# Patient Record
Sex: Male | Born: 1970 | Race: Black or African American | Hispanic: No | Marital: Married | Smoking: Former smoker
Health system: Southern US, Community
[De-identification: ages and names within clinical notes are randomized; demographics above are authoritative.]

---

## 1998-06-23 ENCOUNTER — Emergency Department (HOSPITAL_COMMUNITY): Admission: EM | Admit: 1998-06-23 | Discharge: 1998-06-23 | Payer: Self-pay | Admitting: Internal Medicine

## 1998-11-24 ENCOUNTER — Emergency Department (HOSPITAL_COMMUNITY): Admission: EM | Admit: 1998-11-24 | Discharge: 1998-11-24 | Payer: Self-pay | Admitting: Emergency Medicine

## 2000-09-21 ENCOUNTER — Emergency Department (HOSPITAL_COMMUNITY): Admission: EM | Admit: 2000-09-21 | Discharge: 2000-09-21 | Payer: Self-pay | Admitting: Emergency Medicine

## 2002-07-15 ENCOUNTER — Emergency Department (HOSPITAL_COMMUNITY): Admission: EM | Admit: 2002-07-15 | Discharge: 2002-07-15 | Payer: Self-pay

## 2003-10-25 ENCOUNTER — Emergency Department (HOSPITAL_COMMUNITY): Admission: EM | Admit: 2003-10-25 | Discharge: 2003-10-25 | Payer: Self-pay | Admitting: Emergency Medicine

## 2004-07-27 ENCOUNTER — Emergency Department (HOSPITAL_COMMUNITY): Admission: EM | Admit: 2004-07-27 | Discharge: 2004-07-27 | Payer: Self-pay | Admitting: Emergency Medicine

## 2004-09-21 ENCOUNTER — Emergency Department (HOSPITAL_COMMUNITY): Admission: EM | Admit: 2004-09-21 | Discharge: 2004-09-22 | Payer: Self-pay | Admitting: Internal Medicine

## 2005-07-21 ENCOUNTER — Emergency Department (HOSPITAL_COMMUNITY): Admission: EM | Admit: 2005-07-21 | Discharge: 2005-07-21 | Payer: Self-pay | Admitting: Emergency Medicine

## 2008-12-15 ENCOUNTER — Emergency Department (HOSPITAL_COMMUNITY): Admission: EM | Admit: 2008-12-15 | Discharge: 2008-12-15 | Payer: Self-pay | Admitting: Emergency Medicine

## 2012-06-03 ENCOUNTER — Emergency Department (HOSPITAL_COMMUNITY)
Admission: EM | Admit: 2012-06-03 | Discharge: 2012-06-03 | Disposition: A | Discharge status: Home / Self Care | Payer: Self-pay | Attending: Emergency Medicine | Admitting: Emergency Medicine

## 2012-06-03 ENCOUNTER — Encounter (HOSPITAL_COMMUNITY): Payer: Self-pay | Admitting: Emergency Medicine

## 2012-06-03 DIAGNOSIS — R209 Unspecified disturbances of skin sensation: Secondary | ICD-10-CM | POA: Insufficient documentation

## 2012-06-03 DIAGNOSIS — F17200 Nicotine dependence, unspecified, uncomplicated: Secondary | ICD-10-CM | POA: Insufficient documentation

## 2012-06-03 DIAGNOSIS — M25519 Pain in unspecified shoulder: Secondary | ICD-10-CM | POA: Insufficient documentation

## 2012-06-03 DIAGNOSIS — M549 Dorsalgia, unspecified: Secondary | ICD-10-CM | POA: Insufficient documentation

## 2012-06-03 MED ORDER — NAPROXEN 375 MG PO TABS: 375.0000 mg | ORAL_TABLET | Freq: Two times a day (BID) | ORAL | Status: DC

## 2012-06-03 NOTE — ED Notes (Signed)
Patient reports that it is painful to sit up straight since he was a child. The patient reports that he has continue back pain and saw a dr. Last year and was told that he may have polio. The patient reports that over the last  4-5 days that pain has worsened and he has some numbness and tingling to his right arm.

## 2012-06-03 NOTE — ED Provider Notes (Signed)
History     CSN: [id number]  Arrival date & time 06/03/12  1315   First MD Initiated Contact with Patient 06/03/12 1559      Chief Complaint  Patient presents with  . Back Pain    (Consider location/radiation/quality/duration/timing/severity/associated sxs/prior treatment) HPI Alex Estrada is a 41 y.o. male who presents with complaint of feeling pain in upper back and right shoulder. States he believes that his entire right side, leg, arm, and possibly organs are smaller on right side. States since childhood, he has had problems with pain to right side due to this. States now he is wearing one orthotics in right shoe. States when sitting down, he has to put a book under right side of the buttock, and when sleeps, usually has an extra mattress pad under right side. Pt states at times gets numbness and tingling in hands. States no pain in the neck or lower back. No problems walking. No chest or abdominal pain. No there complaints. Has not been evaluated for this problem before.      History reviewed. No pertinent past medical history.  History reviewed. No pertinent past surgical history.  No family history on file.  History  Substance Use Topics  . Smoking status: Current Every Day Smoker  . Smokeless tobacco: Not on file  . Alcohol Use: Yes     Comment: occ      Review of Systems  Constitutional: Negative for fever and chills.  HENT: Negative for neck pain and neck stiffness.   Respiratory: Negative.   Cardiovascular: Negative.   Musculoskeletal: Positive for arthralgias. Negative for myalgias.  Skin: Negative.   Neurological: Positive for numbness. Negative for weakness.    Allergies  Review of patient's allergies indicates no known allergies.  Home Medications  No current outpatient prescriptions on file.  BP 117/65  Pulse 73  Temp 98.1 F (36.7 C) (Oral)  Resp 16  SpO2 100%  Physical Exam  Nursing note and vitals reviewed. Constitutional: He is  oriented to person, place, and time. He appears well-developed and well-nourished. No distress.  HENT:  Head: Normocephalic.  Eyes: Conjunctivae normal are normal.  Neck: Normal range of motion. Neck supple.  Cardiovascular: Normal rate, regular rhythm and normal heart sounds.   Pulmonary/Chest: Effort normal and breath sounds normal. No respiratory distress. He has no wheezes. He has no rales.  Musculoskeletal:       Full rom of bilateral shoulders, elbows, hips, knees. Good strength in all extremities. Entire spine non tender. i do not see any evidence of size difference or atrophy between right and left sides/extremities. No signs of skoliosis with pt bending forward. Gait normal. He is tender over the left upper back muscles just medial to the scapula.   Neurological: He is alert and oriented to person, place, and time.  Skin: Skin is warm and dry.    ED Course  Procedures (including critical care time)  Pt here with some pain to the right upper back and right shoulder and apparently believing his right side is smaller then left. Although this may be true, i do not see obvious difference on my exam, and believe pt will need further evaluation by a specialist, such an orthopedist. He may also benefit from specialised orthotics and physical therapy if that is the case. Will refer to ortho. He is otherwise non toxic, no distress. VS normal. Stable for d/c home in naprosyn for pain.   1. Upper back pain on right side  MDM         Lottie Mussel, PA 06/03/12 2330

## 2012-06-03 NOTE — ED Provider Notes (Signed)
Medical screening examination/treatment/procedure(s) were performed by non-physician practitioner and as supervising physician I was immediately available for consultation/collaboration.   Ruby Dilone H Rollen Selders, MD 06/03/12 2347 

## 2013-11-13 ENCOUNTER — Emergency Department (HOSPITAL_COMMUNITY)
Admission: EM | Admit: 2013-11-13 | Discharge: 2013-11-13 | Disposition: A | Discharge status: Home / Self Care | Payer: PRIVATE HEALTH INSURANCE | Attending: Emergency Medicine | Admitting: Emergency Medicine

## 2013-11-13 ENCOUNTER — Emergency Department (HOSPITAL_COMMUNITY): Payer: PRIVATE HEALTH INSURANCE

## 2013-11-13 ENCOUNTER — Encounter (HOSPITAL_COMMUNITY): Payer: Self-pay | Admitting: Emergency Medicine

## 2013-11-13 DIAGNOSIS — M79672 Pain in left foot: Secondary | ICD-10-CM

## 2013-11-13 DIAGNOSIS — M25579 Pain in unspecified ankle and joints of unspecified foot: Secondary | ICD-10-CM | POA: Insufficient documentation

## 2013-11-13 DIAGNOSIS — Z791 Long term (current) use of non-steroidal anti-inflammatories (NSAID): Secondary | ICD-10-CM | POA: Insufficient documentation

## 2013-11-13 DIAGNOSIS — F17200 Nicotine dependence, unspecified, uncomplicated: Secondary | ICD-10-CM | POA: Insufficient documentation

## 2013-11-13 MED ORDER — NAPROXEN 500 MG PO TABS: 500.0000 mg | ORAL_TABLET | Freq: Two times a day (BID) | ORAL | Status: DC

## 2013-11-13 NOTE — Progress Notes (Signed)
Orthopedic Tech Progress Note Patient Details:  Alex Estrada 1970/07/01 [id number] Patient refused cam walker boot Dr. Truitt MerleNotified.  Patient ID: Alex Estrada, male   DOB: 1970/07/01, 43 y.o.   MRN: [id number]   Jennye Moccasinnthony Craig Math Brazie 11/13/2013, 5:50 PM

## 2013-11-13 NOTE — ED Notes (Signed)
Pt presents with Left ankle pain, states he was walking at work when he noticed the pain, pt denies any injury, but reports feeling "something protruding out."

## 2013-11-13 NOTE — ED Notes (Signed)
Pt. Refused the Cam Walker

## 2013-11-13 NOTE — Discharge Instructions (Signed)
Cryotherapy °Cryotherapy means treatment with cold. Ice or gel packs can be used to reduce both pain and swelling. Ice is the most helpful within the first 24 to 48 hours after an injury or flareup from overusing a muscle or joint. Sprains, strains, spasms, burning pain, shooting pain, and aches can all be eased with ice. Ice can also be used when recovering from surgery. Ice is effective, has very few side effects, and is safe for most people to use. °PRECAUTIONS  °Ice is not a safe treatment option for people with: °· Raynaud's phenomenon. This is a condition affecting small blood vessels in the extremities. Exposure to cold may cause your problems to return. °· Cold hypersensitivity. There are many forms of cold hypersensitivity, including: °· Cold urticaria. Red, itchy hives appear on the skin when the tissues begin to warm after being iced. °· Cold erythema. This is a red, itchy rash caused by exposure to cold. °· Cold hemoglobinuria. Red blood cells break down when the tissues begin to warm after being iced. The hemoglobin that carry oxygen are passed into the urine because they cannot combine with blood proteins fast enough. °· Numbness or altered sensitivity in the area being iced. °If you have any of the following conditions, do not use ice until you have discussed cryotherapy with your caregiver: °· Heart conditions, such as arrhythmia, angina, or chronic heart disease. °· High blood pressure. °· Healing wounds or open skin in the area being iced. °· Current infections. °· Rheumatoid arthritis. °· Poor circulation. °· Diabetes. °Ice slows the blood flow in the region it is applied. This is beneficial when trying to stop inflamed tissues from spreading irritating chemicals to surrounding tissues. However, if you expose your skin to cold temperatures for too long or without the proper protection, you can damage your skin or nerves. Watch for signs of skin damage due to cold. °HOME CARE INSTRUCTIONS °Follow  these tips to use ice and cold packs safely. °· Place a dry or damp towel between the ice and skin. A damp towel will cool the skin more quickly, so you may need to shorten the time that the ice is used. °· For a more rapid response, add gentle compression to the ice. °· Ice for no more than 10 to 20 minutes at a time. The bonier the area you are icing, the less time it will take to get the benefits of ice. °· Check your skin after 5 minutes to make sure there are no signs of a poor response to cold or skin damage. °· Rest 20 minutes or more in between uses. °· Once your skin is numb, you can end your treatment. You can test numbness by very lightly touching your skin. The touch should be so light that you do not see the skin dimple from the pressure of your fingertip. When using ice, most people will feel these normal sensations in this order: cold, burning, aching, and numbness. °· Do not use ice on someone who cannot communicate their responses to pain, such as small children or people with dementia. °HOW TO MAKE AN ICE PACK °Ice packs are the most common way to use ice therapy. Other methods include ice massage, ice baths, and cryo-sprays. Muscle creams that cause a cold, tingly feeling do not offer the same benefits that ice offers and should not be used as a substitute unless recommended by your caregiver. °To make an ice pack, do one of the following: °· Place crushed ice or   a bag of frozen vegetables in a sealable plastic bag. Squeeze out the excess air. Place this bag inside another plastic bag. Slide the bag into a pillowcase or place a damp towel between your skin and the bag.  Mix 3 parts water with 1 part rubbing alcohol. Freeze the mixture in a sealable plastic bag. When you remove the mixture from the freezer, it will be slushy. Squeeze out the excess air. Place this bag inside another plastic bag. Slide the bag into a pillowcase or place a damp towel between your skin and the bag. SEEK MEDICAL  CARE IF:  You develop white spots on your skin. This may give the skin a blotchy (mottled) appearance.  Your skin turns blue or pale.  Your skin becomes waxy or hard.  Your swelling gets worse. MAKE SURE YOU:   Understand these instructions.  Will watch your condition.  Will get help right away if you are not doing well or get worse. Document Released: 02/07/2011 Document Revised: 09/05/2011 Document Reviewed: 02/07/2011 ExitCare P Pain of Unknown Etiology (Pain Without a Known Cause) You have come to your caregiver because of pain. Pain can occur in any part of the body. Often there is not a definite cause. If your laboratory (blood or urine) work was normal and X-rays or other studies were normal, your caregiver may treat you without knowing the cause of the pain. An example of this is the headache. Most headaches are diagnosed by taking a history. This means your caregiver asks you questions about your headaches. Your caregiver determines a treatment based on your answers. Usually testing done for headaches is normal. Often testing is not done unless there is no response to medications. Regardless of where your pain is located today, you can be given medications to make you comfortable. If no physical cause of pain can be found, most cases of pain will gradually leave as suddenly as they came.  If you have a painful condition and no reason can be found for the pain, it is important that you follow up with your caregiver. If the pain becomes worse or does not go away, it may be necessary to repeat tests and look further for a possible cause.  Only take over-the-counter or prescription medicines for pain, discomfort, or fever as directed by your caregiver.  For the protection of your privacy, test results cannot be given over the phone. Make sure you receive the results of your test. Ask how these results are to be obtained if you have not been informed. It is your responsibility to obtain  your test results.  You may continue all activities unless the activities cause more pain. When the pain lessens, it is important to gradually resume normal activities. Resume activities by beginning slowly and gradually increasing the intensity and duration of the activities or exercise. During periods of severe pain, bed rest may be helpful. Lie or sit in any position that is comfortable.  Ice used for acute (sudden) conditions may be effective. Use a large plastic bag filled with ice and wrapped in a towel. This may provide pain relief.  See your caregiver for continued problems. Your caregiver can help or refer you for exercises or physical therapy if necessary. If you were given medications for your condition, do not drive, operate machinery or power tools, or sign legal documents for 24 hours. Do not drink alcohol, take sleeping pills, or take other medications that may interfere with treatment. See your caregiver immediately if you  have pain that is becoming worse and not relieved by medications. Document Released: 03/08/2001 Document Revised: 04/03/2013 Document Reviewed: 06/13/2005 [facility] Patient Information 2014 [employer], [place]. atient Information [address]2014 ExitCare, [place].

## 2013-11-13 NOTE — ED Provider Notes (Signed)
CSN: [id number]     Arrival date & time 11/13/13  1524 History  This chart was scribed for Arthor CaptainAbigail Maybelline Kolarik, working with Rolland PorterMark James, MD, by Beverly MilchJ Harrison Collins ED Scribe. This patient was seen in room TR05C/TR05C and the patient's care was started at 5:17 PM.    Chief Complaint  Patient presents with  . Foot Pain     Patient is a 43 y.o. male presenting with ankle pain. The history is provided by the patient. No language interpreter was used.  Ankle Pain Location:  Ankle Time since incident:  12 hours Injury: no   Ankle location:  L ankle Pain details:    Quality:  Shooting and aching   Radiates to:  L leg   Severity:  Moderate   Onset quality:  Gradual   Duration:  12 hours   Timing:  Constant   Progression:  Worsening Chronicity:  New Dislocation: no   Foreign body present:  No foreign bodies Tetanus status:  Unknown Prior injury to area:  No Relieved by:  Nothing Worsened by:  Exercise and activity Ineffective treatments:  None tried Associated symptoms: no back pain, no fever and no neck pain     HPI Comments: Haynes Dageaul Swiatek is a 43 y.o. male who presents to the Emergency Department complaining of gradual onset left ankle pain that began this afternoon while he was walking at work at 5 AM. Pt states that as he continued to walk on the left ankle the pain worsened and it went numb with pain shooting up his calf. He states he also feels something "protruding." Pt denies injury. Pt states he works in a [employer], [employer] with concrete floor. He states he has taken nothing to ease his pain. Pt states he ran track and played basketball when he was younger. He denies any h/o similar pain.   History reviewed. No pertinent past medical history. History reviewed. No pertinent past surgical history. History reviewed. No pertinent family history.   History  Substance Use Topics  . Smoking status: Current Every Day Smoker  . Smokeless tobacco: Not on file  . Alcohol Use: Yes     Comment:  occ    Review of Systems  Constitutional: Negative for fever and chills.  HENT: Negative for congestion, rhinorrhea, sore throat and trouble swallowing.   Eyes: Negative for discharge.  Respiratory: Negative for cough.   Musculoskeletal: Positive for arthralgias. Negative for back pain, joint swelling, myalgias, neck pain and neck stiffness.  Neurological: Negative for headaches.     Allergies  Review of patient's allergies indicates no known allergies.   Home Medications   Prior to Admission medications   Medication Sig Start Date End Date Taking? Authorizing Provider  naproxen (NAPROSYN) 375 MG tablet Take 1 tablet (375 mg total) by mouth 2 (two) times daily. 06/03/12   Tatyana A Kirichenko, PA-C    Triage Vitals: BP 114/83  Pulse 82  Temp(Src) 98.8 F (37.1 C) (Oral)  Resp 18  Ht 6\' 1"  (1.854 m)  Wt 223 lb (101.152 kg)  BMI 29.43 kg/m2  SpO2 97%   Physical Exam  Nursing note and vitals reviewed. Constitutional: He is oriented to person, place, and time. He appears well-developed and well-nourished. No distress.  Awake, alert, nontoxic appearance.  HENT:  Head: Normocephalic and atraumatic.  Eyes: EOM are normal. Right eye exhibits no discharge. Left eye exhibits no discharge.  Neck: Neck supple.  Pulmonary/Chest: Effort normal. He exhibits no tenderness.  Abdominal: Soft. There is no tenderness.  There is no rebound.  Musculoskeletal:       Left ankle: Tenderness. Lateral malleolus tenderness found.  Tenderness to the left ankle inferior to the lateral malleolus, there is a callus on the skin and about 1 cm below malleolus a firm nodule that is depressible with firm palpation. It is also tender, no heat, redness, or swelling. DP intact.  Neurological: He is alert and oriented to person, place, and time.  Mental status and motor strength appears baseline for patient and situation.  Skin: Skin is warm and dry. No rash noted. He is not diaphoretic.  Psychiatric: He  has a normal mood and affect. His behavior is normal.    ED Course  Procedures (including critical care time)   DIAGNOSTIC STUDIES: Oxygen Saturation is 97% on RA, normal by my interpretation.     COORDINATION OF CARE: 5:28 PM- Pt advised of plan for treatment and pt agrees.   Imaging Review Dg Ankle Complete Left  11/13/2013   CLINICAL DATA:  Pain over the lateral malleolus, no trauma  EXAM: LEFT ANKLE COMPLETE - 3+ VIEW  COMPARISON:  None.  FINDINGS: There is no evidence of fracture, dislocation, or joint effusion. There is no evidence of arthropathy or other focal bone abnormality. Soft tissues are unremarkable.  IMPRESSION: Negative.   Electronically Signed   By: Christiana PellantGretchen  Green M.D.   On: 11/13/2013 16:53    MDM   Final diagnoses:  Left foot pain    Patient with nodule inferior to the left lat maleolus. ? Ganglion cyst.   Patient X-Ray negative for obvious fracture or dislocation.I personally reviewed the images using our PACS system. Pain managed in ED. Pt advised to follow up with orthopedics if symptoms persist for possibility of missed fracture diagnosis. Patient given brace while in ED, however he refused as he needs to wear steel toed boots at work. Conservative therapy recommended and discussed. Patient will be dc home & is agreeable with above plan.   I personally performed the services described in this documentation, which was scribed in my presence. The recorded information has been reviewed and is accurate.    Arthor CaptainAbigail Jaimon Bugaj, PA-C 11/15/13 1006

## 2013-11-17 NOTE — ED Provider Notes (Signed)
Medical screening examination/treatment/procedure(s) were performed by non-physician practitioner and as supervising physician I was immediately available for consultation/collaboration.   EKG Interpretation None        Kaydra Borgen, MD 11/17/13 0614 

## 2015-06-29 ENCOUNTER — Emergency Department (HOSPITAL_COMMUNITY)
Admission: EM | Admit: 2015-06-29 | Discharge: 2015-06-29 | Disposition: A | Discharge status: Home / Self Care | Payer: PRIVATE HEALTH INSURANCE | Attending: Emergency Medicine | Admitting: Emergency Medicine

## 2015-06-29 ENCOUNTER — Encounter (HOSPITAL_COMMUNITY): Payer: Self-pay

## 2015-06-29 DIAGNOSIS — F17200 Nicotine dependence, unspecified, uncomplicated: Secondary | ICD-10-CM | POA: Insufficient documentation

## 2015-06-29 DIAGNOSIS — Z791 Long term (current) use of non-steroidal anti-inflammatories (NSAID): Secondary | ICD-10-CM | POA: Insufficient documentation

## 2015-06-29 DIAGNOSIS — H6092 Unspecified otitis externa, left ear: Secondary | ICD-10-CM

## 2015-06-29 DIAGNOSIS — J029 Acute pharyngitis, unspecified: Secondary | ICD-10-CM | POA: Insufficient documentation

## 2015-06-29 MED ORDER — CIPROFLOXACIN HCL 500 MG PO TABS: 500.0000 mg | ORAL_TABLET | Freq: Two times a day (BID) | ORAL | Status: AC

## 2015-06-29 MED ORDER — NEOMYCIN-POLYMYXIN-HC 3.5-10000-1 OT SUSP: 4.0000 [drp] | Freq: Three times a day (TID) | OTIC | Status: AC

## 2015-06-29 NOTE — ED Provider Notes (Signed)
CSN: [id number]     Arrival date & time 06/29/15  1717 History   By signing my name below, I, Alex Estrada, attest that this documentation has been prepared under the direction and in the presence of Queens Medical CenterJaime Ward PA-C.  Electronically Signed: Arlan OrganAshley Estrada, ED Scribe. 06/29/2015. 7:01 PM.   Chief Complaint  Patient presents with  . Otalgia   The history is provided by the patient. No language interpreter was used.    HPI Comments: Alex Estrada is a 45 y.o. male without any pertinent past medical history who presents to the Emergency Department complaining of constant, ongoing L otalgia x 3 days. Pain is described as throbbing. Discomfort to ear is exacerbated with palpation. No alleviating factors at this time. Pt admits he swims 3-4 days weekly as he is attempting to lose some weight. He also reports mild white discharge from the L ear. OTC swimmers ear solution attempted at home without any improvement. No recent fever, chills, nausea, vomiting, chest pain, or shortness of breath.  PCP: No primary care provider on file.    No past medical history on file. No past surgical history on file. No family history on file. Social History  Substance Use Topics  . Smoking status: Current Every Day Smoker  . Smokeless tobacco: Not on file  . Alcohol Use: Yes     Comment: occ    Review of Systems  Constitutional: Negative for fever and chills.  HENT: Positive for ear discharge, ear pain and sore throat.   Respiratory: Negative for shortness of breath.   Cardiovascular: Negative for chest pain.  Gastrointestinal: Negative for nausea and vomiting.  Psychiatric/Behavioral: Negative for confusion.      Allergies  Review of patient's allergies indicates no known allergies.  Home Medications   Prior to Admission medications   Medication Sig Start Date End Date Taking? Authorizing Provider  ciprofloxacin (CIPRO) 500 MG tablet Take 1 tablet (500 mg total) by mouth 2 (two) times daily.  06/29/15   Chase PicketJaime Pilcher Ward, PA-C  naproxen (NAPROSYN) 500 MG tablet Take 1 tablet (500 mg total) by mouth 2 (two) times daily with a meal. 11/13/13   Arthor CaptainAbigail Harris, PA-C  neomycin-polymyxin-hydrocortisone (CORTISPORIN) 3.5-10000-1 otic suspension Place 4 drops into the left ear 3 (three) times daily. 06/29/15   Chase PicketJaime Pilcher Ward, PA-C   Triage Vitals: BP 127/81 mmHg  Pulse 84  Temp(Src) 98.3 F (36.8 C) (Oral)  Resp 16  SpO2 98%   Physical Exam  Constitutional: He is oriented to person, place, and time. He appears well-developed and well-nourished.  HENT:  Head: Normocephalic.  Right Ear: Tympanic membrane, external ear and ear canal normal.  Mouth/Throat: Uvula is midline and mucous membranes are normal. No oropharyngeal exudate or posterior oropharyngeal edema.  Left ear with significant canal swelling and white discharge. + tragal tenderness   Eyes: EOM are normal.  Neck: Normal range of motion. Neck supple.  Pulmonary/Chest: Effort normal.  Abdominal: He exhibits no distension.  Musculoskeletal: Normal range of motion.  Neurological: He is alert and oriented to person, place, and time.  Psychiatric: He has a normal mood and affect.  Nursing note and vitals reviewed.   ED Course  Procedures (including critical care time)  DIAGNOSTIC STUDIES: Oxygen Saturation is 98% on RA, Normal by my interpretation.    COORDINATION OF CARE: 6:55 PM-Discussed treatment plan with pt at bedside and pt agreed to plan.     Labs Review Labs Reviewed - No data to display  Imaging  Review No results found. I have personally reviewed and evaluated these images and lab results as part of my medical decision-making.   EKG Interpretation None      MDM   Final diagnoses:  Otitis externa, left   [name] presents with left ear pain and discharge c/w otitis externa  Therapeutics: TM not visible upon initial exam due to discharge, therefore ear was irrigated with saline. After saline  irrigation, TM was visible and intact with no erythema or bulging.   A&P: Otitis externa  - Cortisporin otic, cipro  - Follow up in 3-4 days  - Return precautions given, home care instructions given.   I personally performed the services described in this documentation, which was scribed in my presence. The recorded information has been reviewed and is accurate.  [facility] Ward, PA-C 06/29/15 2008  Nelva Nay, MD 07/04/15 [phone]

## 2015-06-29 NOTE — Discharge Instructions (Signed)
Otitis Externa  Follow up with your primary doctor in 7 days to assure treatment has worked. If there is no improvement in 2-3 days, please follow up sooner. Tylenol and/or motrin as needed for pain  Swimmer's ear (otitis externa) is an infection in the outer ear canal. It can be caused by a germ or a fungus. It may be caused by swimming in dirty water or water that stays in the ear after swimming.   HOME CARE Put drops in the ear canal as told by your doctor.  Only take medicine as told by your doctor.   GET HELP RIGHT AWAY IF:  You have a temperature by mouth above 102 F (38.9 C), not controlled by medicine.  There is ear pain after 3 days.  You develop an allergic reaction, itchy rash, loss of hearing

## 2015-06-29 NOTE — ED Notes (Signed)
PT DISCHARGED. INSTRUCTIONS AND PRESCRIPTIONS GIVEN. AAOX3. PT REFUSED VITAL REASSESSMENT. PT IN NO APPARENT DISTRESS. THE OPPORTUNITY TO ASK QUESTIONS WAS PROVIDED.

## 2015-06-29 NOTE — ED Notes (Signed)
Pt swims regularly, c/o left ear pain. Thick white mucus in ear canal, tympanic membrane intact, pearly gray, pt unable to hold still with otoscope in ear long enough for this RN to further evaluate TM.

## 2015-07-04 ENCOUNTER — Emergency Department (HOSPITAL_COMMUNITY)
Admission: EM | Admit: 2015-07-04 | Discharge: 2015-07-04 | Disposition: A | Discharge status: Home / Self Care | Payer: PRIVATE HEALTH INSURANCE | Attending: Emergency Medicine | Admitting: Emergency Medicine

## 2015-07-04 ENCOUNTER — Encounter (HOSPITAL_COMMUNITY): Payer: Self-pay | Admitting: Nurse Practitioner

## 2015-07-04 DIAGNOSIS — R0981 Nasal congestion: Secondary | ICD-10-CM | POA: Insufficient documentation

## 2015-07-04 DIAGNOSIS — Z792 Long term (current) use of antibiotics: Secondary | ICD-10-CM | POA: Insufficient documentation

## 2015-07-04 DIAGNOSIS — F17200 Nicotine dependence, unspecified, uncomplicated: Secondary | ICD-10-CM | POA: Insufficient documentation

## 2015-07-04 DIAGNOSIS — Z7952 Long term (current) use of systemic steroids: Secondary | ICD-10-CM | POA: Insufficient documentation

## 2015-07-04 DIAGNOSIS — H6092 Unspecified otitis externa, left ear: Secondary | ICD-10-CM | POA: Insufficient documentation

## 2015-07-04 DIAGNOSIS — Z791 Long term (current) use of non-steroidal anti-inflammatories (NSAID): Secondary | ICD-10-CM | POA: Insufficient documentation

## 2015-07-04 MED ORDER — IBUPROFEN 600 MG PO TABS: 600.0000 mg | ORAL_TABLET | Freq: Four times a day (QID) | ORAL | Status: AC | PRN

## 2015-07-04 NOTE — Discharge Instructions (Signed)
Finish your course of Ciprofloxacin; continue using the ear drops given to you as prescribed (4 drops into the left ear 3 (three) times daily for 1 week). Take ibuprofen as needed for pain. You may follow up with an ear, nose, and throat doctor if desired.  Otitis Externa Otitis externa is a bacterial or fungal infection of the outer ear canal. This is the area from the eardrum to the outside of the ear. Otitis externa is sometimes called "swimmer's ear." CAUSES  Possible causes of infection include:  Swimming in dirty water.  Moisture remaining in the ear after swimming or bathing.  Mild injury (trauma) to the ear.  Objects stuck in the ear (foreign body).  Cuts or scrapes (abrasions) on the outside of the ear. SIGNS AND SYMPTOMS  The first symptom of infection is often itching in the ear canal. Later signs and symptoms may include swelling and redness of the ear canal, ear pain, and yellowish-white fluid (pus) coming from the ear. The ear pain may be worse when pulling on the earlobe. DIAGNOSIS  Your health care provider will perform a physical exam. A sample of fluid may be taken from the ear and examined for bacteria or fungi. TREATMENT  Antibiotic ear drops are often given for 10 to 14 days. Treatment may also include pain medicine or corticosteroids to reduce itching and swelling. HOME CARE INSTRUCTIONS   Apply antibiotic ear drops to the ear canal as prescribed by your health care provider.  Take medicines only as directed by your health care provider.  If you have diabetes, follow any additional treatment instructions from your health care provider.  Keep all follow-up visits as directed by your health care provider. PREVENTION   Keep your ear dry. Use the corner of a towel to absorb water out of the ear canal after swimming or bathing.  Avoid scratching or putting objects inside your ear. This can damage the ear canal or remove the protective wax that lines the canal. This  makes it easier for bacteria and fungi to grow.  Avoid swimming in lakes, polluted water, or poorly chlorinated pools.  You may use ear drops made of rubbing alcohol and vinegar after swimming. Combine equal parts of white vinegar and alcohol in a bottle. Put 3 or 4 drops into each ear after swimming. SEEK MEDICAL CARE IF:   You have a fever.  Your ear is still red, swollen, painful, or draining pus after 3 days.  Your redness, swelling, or pain gets worse.  You have a severe headache.  You have redness, swelling, pain, or tenderness in the area behind your ear. MAKE SURE YOU:   Understand these instructions.  Will watch your condition.  Will get help right away if you are not doing well or get worse.   This information is not intended to replace advice given to you by your health care provider. Make sure you discuss any questions you have with your health care provider.   Document Released: 06/13/2005 Document Revised: 07/04/2014 Document Reviewed: 06/30/2011 Elsevier Interactive Patient Education [employer] Inc[employer]

## 2015-07-04 NOTE — ED Notes (Signed)
Pt states he was was seen last week for left ear pain that has not improved. He states he wasn't able to fill the Rx for 2 days.

## 2015-07-04 NOTE — ED Provider Notes (Signed)
CSN: [id number]     Arrival date & time 07/04/15  1954 History   First MD Initiated Contact with Patient 07/04/15 2020     Chief Complaint  Patient presents with  . Otalgia  . Follow-up     (Consider location/radiation/quality/duration/timing/severity/associated sxs/prior Treatment) HPI Comments: 45 year old male presents to the emergency department for evaluation of persistent otalgia. Patient states that he was seen in the emergency department on 06/29/2015. He was diagnosed with otitis externa and prescribed ciprofloxacin as well as Cortisporin Rx. He states that he has been taking ciprofloxacin as prescribed. He has 2 days left on this prescription. He was unable to fill his prescription for Cortisporin eardrops until 2 days ago. He reports having some improvement in symptoms after starting oral abx, but that over the past 48 hours he feels as though his ear has become increasingly clogged. He denies any discharge from his ear or any fever. There is some mild nasal congestion without rhinorrhea he has a sporadic ache in his ear at times for which he has not taken any additional medications  Patient is a 45 y.o. male presenting with ear pain. The history is provided by the patient. No language interpreter was used.  Otalgia Associated symptoms: congestion   Associated symptoms: no ear discharge, no fever and no rhinorrhea     History reviewed. No pertinent past medical history. History reviewed. No pertinent past surgical history. History reviewed. No pertinent family history. Social History  Substance Use Topics  . Smoking status: Current Every Day Smoker  . Smokeless tobacco: None  . Alcohol Use: Yes     Comment: occ    Review of Systems  Constitutional: Negative for fever.  HENT: Positive for congestion and ear pain. Negative for ear discharge and rhinorrhea.   All other systems reviewed and are negative.   Allergies  Review of patient's allergies indicates no known  allergies.  Home Medications   Prior to Admission medications   Medication Sig Start Date End Date Taking? Authorizing Provider  ciprofloxacin (CIPRO) 500 MG tablet Take 1 tablet (500 mg total) by mouth 2 (two) times daily. 06/29/15   Jaime Pilcher Ward, PA-C  ibuprofen (ADVIL,MOTRIN) 600 MG tablet Take 1 tablet (600 mg total) by mouth every 6 (six) hours as needed. 07/04/15   Antony MaduraKelly Danene Montijo, PA-C  naproxen (NAPROSYN) 500 MG tablet Take 1 tablet (500 mg total) by mouth 2 (two) times daily with a meal. 11/13/13   Arthor CaptainAbigail Harris, PA-C  neomycin-polymyxin-hydrocortisone (CORTISPORIN) 3.5-10000-1 otic suspension Place 4 drops into the left ear 3 (three) times daily. 06/29/15   Jaime Pilcher Ward, PA-C   BP 101/76 mmHg  Pulse 88  Temp(Src) 97.8 F (36.6 C) (Oral)  Resp 18  SpO2 96%   Physical Exam  Constitutional: He is oriented to person, place, and time. He appears well-developed and well-nourished. No distress.  Nontoxic/nonseptic appearing. Patient pleasant.  HENT:  Head: Normocephalic and atraumatic.  Right Ear: Tympanic membrane, external ear and ear canal normal.  Mouth/Throat: Oropharynx is clear and moist.  Swelling to the ear canal of the left ear with scant white discharge noted. Mild narrowing of the ear canal secondary to swelling, though tympanic membrane remains visible and is without perforation or significant erythema. No tenderness when palpating the tragus or pulling on the auricle of the left ear. No mastoid swelling, erythema, heat to touch, or tenderness to palpation bilaterally. No trismus.  Eyes: Conjunctivae and EOM are normal. No scleral icterus.  Neck: Normal  range of motion.  No nuchal rigidity or meningismus  Pulmonary/Chest: Effort normal. No respiratory distress.  Respirations even and unlabored  Musculoskeletal: Normal range of motion.  Neurological: He is alert and oriented to person, place, and time. He exhibits normal muscle tone. Coordination normal.  Skin: Skin  is warm and dry. No rash noted. He is not diaphoretic. No erythema. No pallor.  Psychiatric: He has a normal mood and affect. His behavior is normal.  Nursing note and vitals reviewed.   ED Course  Procedures (including critical care time) Labs Review Labs Reviewed - No data to display  Imaging Review No results found.   I have personally reviewed and evaluated these images and lab results as part of my medical decision-making.   EKG Interpretation None      MDM   Final diagnoses:  Otitis externa, left    45 year old male presents to the emergency department for evaluation of persistent ear pain; dx with OE on 06/29/15. He reports some initial improvement with ciprofloxacin, but has had some worsening of symptoms over the past 48 hours. He was unable to fill his Rx for Cortisporin eardrops until 2 days ago. Patient with persistent OE on exam. No concern for meningitis or mastoiditis. Also doubt malignant OE. Patient is afebrile and well-appearing. Have advised the patient to finish out his course of Cipro and to use Cortisporin for 1 week. Will refer to ENT for persistent symptoms. Ibuprofen recommended for aching pain. Return precautions discussed and provided. Patient discharged in good condition with known address concerns.   Filed Vitals:   07/04/15 2004  BP: 101/76  Pulse: 88  Temp: 97.8 F (36.6 C)  TempSrc: Oral  Resp: 18  SpO2: 96%     Antony Madura, PA-C 07/04/15 2045  Lorre Nick, MD 07/08/15 [phone]

## 2015-11-27 ENCOUNTER — Encounter (HOSPITAL_COMMUNITY): Payer: Self-pay | Admitting: *Deleted

## 2015-11-27 ENCOUNTER — Emergency Department (HOSPITAL_COMMUNITY)
Admission: EM | Admit: 2015-11-27 | Discharge: 2015-11-27 | Disposition: A | Discharge status: Home / Self Care | Payer: PRIVATE HEALTH INSURANCE | Attending: Emergency Medicine | Admitting: Emergency Medicine

## 2015-11-27 DIAGNOSIS — F17200 Nicotine dependence, unspecified, uncomplicated: Secondary | ICD-10-CM | POA: Insufficient documentation

## 2015-11-27 DIAGNOSIS — R369 Urethral discharge, unspecified: Secondary | ICD-10-CM | POA: Insufficient documentation

## 2015-11-27 LAB — URINALYSIS, ROUTINE W REFLEX MICROSCOPIC
Bilirubin Urine: NEGATIVE
Glucose, UA: NEGATIVE mg/dL
Hgb urine dipstick: NEGATIVE
Ketones, ur: NEGATIVE mg/dL
NITRITE: NEGATIVE
PROTEIN: NEGATIVE mg/dL
SPECIFIC GRAVITY, URINE: 1.022 (ref 1.005–1.030)
pH: 6 (ref 5.0–8.0)

## 2015-11-27 LAB — URINE MICROSCOPIC-ADD ON: SQUAMOUS EPITHELIAL / LPF: NONE SEEN

## 2015-11-27 MED ORDER — ONDANSETRON 8 MG PO TBDP
8.0000 mg | ORAL_TABLET | Freq: Once | ORAL | Status: AC
  Administered 2015-11-27: 8 mg via ORAL
  Filled 2015-11-27: qty 1

## 2015-11-27 MED ORDER — AZITHROMYCIN 250 MG PO TABS
1000.0000 mg | ORAL_TABLET | Freq: Once | ORAL | Status: AC
  Administered 2015-11-27: 1000 mg via ORAL
  Filled 2015-11-27: qty 4

## 2015-11-27 MED ORDER — METRONIDAZOLE 500 MG PO TABS
2000.0000 mg | ORAL_TABLET | Freq: Once | ORAL | Status: AC
  Administered 2015-11-27: 2000 mg via ORAL
  Filled 2015-11-27: qty 4

## 2015-11-27 MED ORDER — CEFTRIAXONE SODIUM 250 MG IJ SOLR
250.0000 mg | Freq: Once | INTRAMUSCULAR | Status: AC
  Administered 2015-11-27: 250 mg via INTRAMUSCULAR
  Filled 2015-11-27: qty 250

## 2015-11-27 NOTE — Discharge Instructions (Signed)
Read the information below.  You may return to the Emergency Department at any time for worsening condition or any new symptoms that concern you.  If you develop high fevers, testicular pain or swelling, abdominal pain, difficulty urinating, uncontrolled vomiting, or are unable to tolerate fluids by mouth, return to the ER for a recheck.    You have been treated empirically for gonorrhea, chlamydia, and trichomonas.  Your testing is all pending.  You may check your results online next week using My Chart.  If any of your tests are positive, all sexual partners should also be tested and treated.

## 2015-11-27 NOTE — ED Notes (Signed)
Pt states yellow penile discharge for two days, denies urinary symptoms or fevers.

## 2015-11-27 NOTE — ED Provider Notes (Signed)
CSN: [id number]     Arrival date & time 11/27/15  2012 History  By signing my name below, I, Alex Estrada, attest that this documentation has been prepared under the direction and in the presence of [facility] LLCE[facility]ily Parthiv Mucci, PA-C. Electronically Signed: Ronney LionSuzanne Estrada, ED Scribe. 11/27/2015. 9:12 PM.    Chief Complaint  Patient presents with  . Penile Discharge   The history is provided by the patient. No language interpreter was used.     HPI Comments: Alex Estrada is a 45 y.o. male who presents to the Emergency Department complaining of constant, moderate, yellow penile discharge that began 2 days ago. Patient states he also feels "more aware of [his] testicles" and states that they feel "more empty" because he has been masturbating more frequently recently. However, he denies any testicular pain or swelling. He reports he is currently sexually active and has had an instance of unprotected intercourse. He denies having any regular STD screenings. He also denies a history of chronic medical conditions. He denies dysuria, abdominal pain, vomiting, fever, back pain, or genital lesions or sores.   History reviewed. No pertinent past medical history. History reviewed. No pertinent past surgical history. No family history on file. Social History  Substance Use Topics  . Smoking status: Current Every Day Smoker  . Smokeless tobacco: None  . Alcohol Use: Yes     Comment: occ    Review of Systems  Constitutional: Negative for fever.  Gastrointestinal: Negative for vomiting and abdominal pain.  Genitourinary: Positive for discharge. Negative for dysuria and genital sores.  Musculoskeletal: Negative for myalgias and back pain.  Skin: Negative for color change and rash.  Allergic/Immunologic: Negative for immunocompromised state.  Hematological: Does not bruise/bleed easily.  Psychiatric/Behavioral: Negative for self-injury.   Allergies  Review of patient's allergies indicates no known allergies.  Home  Medications   Prior to Admission medications   Medication Sig Start Date End Date Taking? Authorizing Provider  ciprofloxacin (CIPRO) 500 MG tablet Take 1 tablet (500 mg total) by mouth 2 (two) times daily. 06/29/15   Jaime Pilcher Ward, PA-C  ibuprofen (ADVIL,MOTRIN) 600 MG tablet Take 1 tablet (600 mg total) by mouth every 6 (six) hours as needed. 07/04/15   Antony MaduraKelly Humes, PA-C  naproxen (NAPROSYN) 500 MG tablet Take 1 tablet (500 mg total) by mouth 2 (two) times daily with a meal. 11/13/13   Arthor CaptainAbigail Harris, PA-C  neomycin-polymyxin-hydrocortisone (CORTISPORIN) 3.5-10000-1 otic suspension Place 4 drops into the left ear 3 (three) times daily. 06/29/15   Jaime Pilcher Ward, PA-C   BP 112/76 mmHg  Pulse 86  Temp(Src) 98.9 F (37.2 C) (Oral)  Resp 18  SpO2 98% Physical Exam  Constitutional: He appears well-developed and well-nourished. No distress.  HENT:  Head: Normocephalic and atraumatic.  Eyes: Conjunctivae are normal. Right eye exhibits no discharge. Left eye exhibits no discharge.  Neck: Neck supple.  Cardiovascular: Normal rate.   Pulmonary/Chest: Effort normal.  Neurological: He is alert. He exhibits normal muscle tone.  Skin: He is not diaphoretic.  Nursing note and vitals reviewed.   ED Course  Procedures (including critical care time)  DIAGNOSTIC STUDIES: Oxygen Saturation is 98% on RA, normal by my interpretation.    COORDINATION OF CARE: 9:03 PM - Pt declines physical/genital exam.Discussed treatment plan with pt at bedside which includes prophylactic treatment for GC/Chlamydia/Trichomonas. Will also collect urine sample for STD screening.  Pt verbalized understanding and agreed to plan.   Labs Review Labs Reviewed  RPR  HIV ANTIBODY (ROUTINE  TESTING)  URINALYSIS, ROUTINE W REFLEX MICROSCOPIC (NOT AT [facility])  GC/CHLAMYDIA PROBE AMP ([place]) NOT AT [facility]   MDM   Final diagnoses:  Abnormal penile discharge   Patient declines genital exam. He has been treated in the  ED for STI with Rocephin, flagyl, and azithromycin. Patient advised to inform and treat all sexual partners if any results positive on My Chart. Given history doubt epidydimitis though pt declined physical exam.  He also denied any lesions.  D/C home.  Discussed testing, treatment, follow up with patient.  Pt given return precautions.  Pt verbalizes understanding and agrees with plan.        I personally performed the services described in this documentation, which was scribed in my presence. The recorded information has been reviewed and is accurate.     Trixie Dredge, PA-C 11/27/15 2205  Laurence Spates, MD 11/28/15 [phone]

## 2015-11-28 LAB — RPR: RPR: NONREACTIVE

## 2015-11-28 LAB — HIV ANTIBODY (ROUTINE TESTING W REFLEX): HIV Screen 4th Generation wRfx: NONREACTIVE

## 2017-04-07 ENCOUNTER — Encounter (HOSPITAL_COMMUNITY): Payer: Self-pay

## 2017-04-07 ENCOUNTER — Emergency Department (HOSPITAL_COMMUNITY): Payer: PRIVATE HEALTH INSURANCE

## 2017-04-07 ENCOUNTER — Emergency Department (HOSPITAL_COMMUNITY)
Admission: EM | Admit: 2017-04-07 | Discharge: 2017-04-07 | Disposition: A | Discharge status: Home / Self Care | Payer: PRIVATE HEALTH INSURANCE | Attending: Emergency Medicine | Admitting: Emergency Medicine

## 2017-04-07 DIAGNOSIS — Y9289 Other specified places as the place of occurrence of the external cause: Secondary | ICD-10-CM | POA: Insufficient documentation

## 2017-04-07 DIAGNOSIS — Z79899 Other long term (current) drug therapy: Secondary | ICD-10-CM | POA: Insufficient documentation

## 2017-04-07 DIAGNOSIS — F17200 Nicotine dependence, unspecified, uncomplicated: Secondary | ICD-10-CM | POA: Insufficient documentation

## 2017-04-07 DIAGNOSIS — Y9389 Activity, other specified: Secondary | ICD-10-CM | POA: Insufficient documentation

## 2017-04-07 DIAGNOSIS — R55 Syncope and collapse: Secondary | ICD-10-CM

## 2017-04-07 DIAGNOSIS — S0181XA Laceration without foreign body of other part of head, initial encounter: Secondary | ICD-10-CM | POA: Insufficient documentation

## 2017-04-07 DIAGNOSIS — Y999 Unspecified external cause status: Secondary | ICD-10-CM | POA: Insufficient documentation

## 2017-04-07 DIAGNOSIS — W0110XA Fall on same level from slipping, tripping and stumbling with subsequent striking against unspecified object, initial encounter: Secondary | ICD-10-CM | POA: Insufficient documentation

## 2017-04-07 LAB — CBG MONITORING, ED: GLUCOSE-CAPILLARY: 118 mg/dL — AB (ref 65–99)

## 2017-04-07 NOTE — ED Triage Notes (Signed)
Patient reports that he has had issues with standing and feeling dizzy his whole life. Patient states he has never passed out before until today. Patient states he stood and fell on his face. Patient has a small laceration to the bridge of his nose and a larger laceration to the left eyebrow area. Bleeding controlled.

## 2017-04-12 NOTE — ED Provider Notes (Signed)
[facility]-EMERGENCY DEPT Provider Note   CSN: [id number] Arrival date & time: 04/07/17  1054     History   Chief Complaint Chief Complaint  Patient presents with  . Fall  . Loss of Consciousness    HPI Alex Estrada is a 46 y.o. male.  HPI  46 year old male syncopal event. Patient states she's had a long-standing history with near syncopal symptoms with changes in position but has not actually passed out recently as far as she is aware. Today he was at a store when he had similar symptoms but actually fell forward and passed out. He did hit his face. Laceration to the left eyebrow. Denies any similar pain anywhere else.  History reviewed. No pertinent past medical history.  There are no active problems to display for this patient.   History reviewed. No pertinent surgical history.     Home Medications    Prior to Admission medications   Medication Sig Start Date End Date Taking? Authorizing Provider  Multiple Vitamin (MULTIVITAMIN WITH MINERALS) TABS tablet Take 1 tablet by mouth daily.   Yes [provider]  ciprofloxacin (CIPRO) 500 MG tablet Take 1 tablet (500 mg total) by mouth 2 (two) times daily. Patient not taking: Reported on 04/07/2017 06/29/15   Ward, Chase Picket, PA-C  ibuprofen (ADVIL,MOTRIN) 600 MG tablet Take 1 tablet (600 mg total) by mouth every 6 (six) hours as needed. Patient not taking: Reported on 04/07/2017 07/04/15   Antony Madura, PA-C  naproxen (NAPROSYN) 500 MG tablet Take 1 tablet (500 mg total) by mouth 2 (two) times daily with a meal. Patient not taking: Reported on 04/07/2017 11/13/13   Arthor Captain, PA-C  neomycin-polymyxin-hydrocortisone (CORTISPORIN) 3.5-10000-1 otic suspension Place 4 drops into the left ear 3 (three) times daily. Patient not taking: Reported on 04/07/2017 06/29/15   Ward, Chase Picket, PA-C    Family History History reviewed. No pertinent family history.  Social History Social  History  Substance Use Topics  . Smoking status: Current Every Day Smoker  . Smokeless tobacco: Never Used  . Alcohol use Yes     Comment: occ     Allergies   Patient has no known allergies.   Review of Systems Review of Systems  All systems reviewed and negative, other than as noted in HPI. Physical Exam Updated Vital Signs BP 122/78 (BP Location: Right Arm)   Pulse 87   Temp 98.5 F (36.9 C) (Oral)   Resp 18   SpO2 99%   Physical Exam  Constitutional: He is oriented to person, place, and time. He appears well-developed and well-nourished. No distress.  HENT:  Head: Normocephalic.  3 cm laceration left eyebrow. No active bleeding. Mildly gaping. Minimal soft tissue tenderness locally. Severe bony tenderness. No midline spinal tenderness.  Eyes: Conjunctivae are normal. Right eye exhibits no discharge. Left eye exhibits no discharge.  Neck: Neck supple.  Cardiovascular: Normal rate, regular rhythm and normal heart sounds.  Exam reveals no gallop and no friction rub.   No murmur heard. Pulmonary/Chest: Effort normal and breath sounds normal. No respiratory distress.  Abdominal: Soft. He exhibits no distension. There is no tenderness.  Musculoskeletal: He exhibits no edema or tenderness.  Neurological: He is alert and oriented to person, place, and time. He displays normal reflexes. No cranial nerve deficit. He exhibits normal muscle tone.  Skin: Skin is warm and dry.  Psychiatric: He has a normal mood and affect. His behavior is normal. Thought content normal.  Nursing note and  vitals reviewed.    ED Treatments / Results  Labs (all labs ordered are listed, but only abnormal results are displayed) Labs Reviewed  CBG MONITORING, ED - Abnormal; Notable for the following:       Result Value   Glucose-Capillary 118 (*)    All other components within normal limits    EKG  EKG Interpretation  Date/Time:  Friday April 07 2017 11:38:05 EDT Ventricular Rate:  71 PR  Interval:    QRS Duration: 93 QT Interval:  391 QTC Calculation: 425 R Axis:   96 Text Interpretation:  Sinus rhythm Borderline right axis deviation ST elev, probable normal early repol pattern No old tracing to compare Confirmed by Mancel BaleWentz, Elliott [phone]([id number]) on 04/07/2017 12:05:33 PM       Radiology No results found.  Procedures Procedures (including critical care time)  LACERATION REPAIR Performed by: Raeford RazorKOHUT, Calisha Tindel Authorized by: Raeford RazorKOHUT, Takeshia Wenk Consent: Verbal consent obtained. Risks and benefits: risks, benefits and alternatives were discussed Consent given by: patient Patient identity confirmed: provided demographic data Prepped and Draped in normal sterile fashion Wound explored  Laceration Location: L eyebrow Laceration Length: 3 cm  No Foreign Bodies seen or palpated  Anesthesia: local infiltration  Local anesthetic: lidocaine 1%   Anesthetic total: 2 ml  Irrigation method: syringe Amount of cleaning: standard  Skin closure: 5-0 plain gut  Number of sutures: 6  Technique: simple interupted  Patient tolerance: Patient tolerated the procedure well with no immediate complications.   Medications Ordered in ED Medications - No data to display   Initial Impression / Assessment and Plan / ED Course  I have reviewed the triage vital signs and the nursing notes.  Pertinent labs & imaging results that were available during my care of the patient were reviewed by me and considered in my medical decision making (see chart for details).   46 year old male with what sounds like orthostatic symptoms in themselves when single event. Long-standing history the same. EKG without overly concerning findings. He is not interested in further workup. He just wants laceration closed. This is done. Return cautions and wound care was discussed.  Final Clinical Impressions(s) / ED Diagnoses   Final diagnoses:  Syncope and collapse  Facial laceration, initial encounter     New Prescriptions Discharge Medication List as of 04/07/2017 12:45 PM       Raeford RazorKohut, Felice Hope, MD 04/12/17 1045

## 2017-12-28 ENCOUNTER — Emergency Department (HOSPITAL_COMMUNITY)
Admission: EM | Admit: 2017-12-28 | Discharge: 2017-12-28 | Disposition: A | Discharge status: Home / Self Care | Payer: PRIVATE HEALTH INSURANCE | Attending: Emergency Medicine | Admitting: Emergency Medicine

## 2017-12-28 ENCOUNTER — Other Ambulatory Visit: Payer: Self-pay

## 2017-12-28 ENCOUNTER — Encounter (HOSPITAL_COMMUNITY): Payer: Self-pay | Admitting: Emergency Medicine

## 2017-12-28 DIAGNOSIS — Z79899 Other long term (current) drug therapy: Secondary | ICD-10-CM | POA: Insufficient documentation

## 2017-12-28 DIAGNOSIS — H60332 Swimmer's ear, left ear: Secondary | ICD-10-CM

## 2017-12-28 DIAGNOSIS — F17200 Nicotine dependence, unspecified, uncomplicated: Secondary | ICD-10-CM | POA: Insufficient documentation

## 2017-12-28 MED ORDER — NEOMYCIN-POLYMYXIN-HC 3.5-10000-1 OT SUSP: 3.0000 [drp] | Freq: Three times a day (TID) | OTIC | 0 refills | Status: AC

## 2017-12-28 NOTE — ED Provider Notes (Addendum)
[facility]-EMERGENCY DEPT Provider Note   CSN: [id number] Arrival date & time: 12/28/17  0226     History   Chief Complaint Chief Complaint  Patient presents with  . Otalgia    HPI Alex Estrada is a 47 y.o. male.  The history is provided by the patient. No language interpreter was used.  Otalgia  This is a new problem. The current episode started more than 2 days ago. There is pain in the left ear. The problem occurs constantly. The problem has been gradually worsening. There has been no fever. The pain is at a severity of 5/10. The pain is moderate. Associated symptoms include rhinorrhea. Pertinent negatives include no abdominal pain and no neck pain. His past medical history does not include chronic ear infection.  No f/c/r. Swims daily.    History reviewed. No pertinent past medical history.  There are no active problems to display for this patient.   History reviewed. No pertinent surgical history.      Home Medications    Prior to Admission medications   Medication Sig Start Date End Date Taking? Authorizing Provider  ciprofloxacin (CIPRO) 500 MG tablet Take 1 tablet (500 mg total) by mouth 2 (two) times daily. Patient not taking: Reported on 04/07/2017 06/29/15   Ward, Chase Picket, PA-C  ibuprofen (ADVIL,MOTRIN) 600 MG tablet Take 1 tablet (600 mg total) by mouth every 6 (six) hours as needed. Patient not taking: Reported on 04/07/2017 07/04/15   Antony Madura, PA-C  Multiple Vitamin (MULTIVITAMIN WITH MINERALS) TABS tablet Take 1 tablet by mouth daily.    [provider]  naproxen (NAPROSYN) 500 MG tablet Take 1 tablet (500 mg total) by mouth 2 (two) times daily with a meal. Patient not taking: Reported on 04/07/2017 11/13/13   Arthor Captain, PA-C  neomycin-polymyxin-hydrocortisone (CORTISPORIN) 3.5-10000-1 otic suspension Place 4 drops into the left ear 3 (three) times daily. Patient not taking: Reported on 04/07/2017 06/29/15   Ward,  Chase Picket, PA-C    Family History History reviewed. No pertinent family history.  Social History Social History   Tobacco Use  . Smoking status: Current Every Day Smoker  . Smokeless tobacco: Never Used  Substance Use Topics  . Alcohol use: Yes    Comment: occ  . Drug use: No     Allergies   Patient has no known allergies.   Review of Systems Review of Systems  Constitutional: Negative for diaphoresis and fever.  HENT: Positive for ear pain and rhinorrhea.   Eyes: Negative for photophobia and visual disturbance.  Respiratory: Negative for shortness of breath.   Cardiovascular: Negative for chest pain.  Gastrointestinal: Negative for abdominal pain.  Genitourinary: Negative for flank pain.  Musculoskeletal: Negative for neck pain.  Neurological: Negative for seizures.  Psychiatric/Behavioral: Negative for confusion.  All other systems reviewed and are negative.    Physical Exam Updated Vital Signs BP 114/79 (BP Location: Left Arm)   Pulse 84   Temp 98.5 F (36.9 C) (Oral)   Resp 16   Ht 6\' 1"  (1.854 m)   Wt 99.8 kg (220 lb)   SpO2 98%   BMI 29.03 kg/m   Physical Exam  Constitutional: He is oriented to person, place, and time. He appears well-developed and well-nourished. No distress.  HENT:  Head: Normocephalic and atraumatic.  Right Ear: External ear normal. Tympanic membrane is not injected, not perforated and not erythematous.  Left Ear: Tympanic membrane is not injected, not perforated and not erythematous.  Mouth/Throat:  No oropharyngeal exudate.  Debris and wet canal L consistent with otitis externa  Eyes: Pupils are equal, round, and reactive to light. Conjunctivae are normal.  Neck: Normal range of motion. Neck supple.  Cardiovascular: Normal rate, regular rhythm, normal heart sounds and intact distal pulses.  Pulmonary/Chest: Effort normal and breath sounds normal. No stridor. He has no wheezes. He has no rales.  Abdominal: Soft. Bowel  sounds are normal. He exhibits no mass. There is no tenderness. There is no rebound and no guarding.  Musculoskeletal: Normal range of motion.  Neurological: He is alert and oriented to person, place, and time.  Skin: Skin is warm and dry. Capillary refill takes less than 2 seconds.  Psychiatric: He has a normal mood and affect.     ED Treatments / Results  Labs (all labs ordered are listed, but only abnormal results are displayed) Labs Reviewed - No data to display  EKG None  Radiology No results found.  Procedures Procedures (including critical care time)  Medications Ordered in ED Medications - No data to display     Final Clinical Impressions(s) / ED Diagnoses    Return for pain, numbness, changes in vision or speech, fevers >100.4 unrelieved by medication, shortness of breath, intractable vomiting, or diarrhea, abdominal pain, Inability to tolerate liquids or food, cough, altered mental status or any concerns. No signs of systemic illness or infection. The patient is nontoxic-appearing on exam and vital signs are within normal limits. Will refer to urology for microscopy hematuria as patient is asymptomatic.  I have reviewed the triage vital signs and the nursing notes. Pertinent labs &imaging results that were available during my care of the patient were reviewed by me and considered in my medical decision making (see chart for details).  After history, exam, and medical workup I feel the patient has been appropriately medically screened and is safe for discharge home. Pertinent diagnoses were discussed with the patient. Patient was given return precautions.   Tyeesha Riker, MD 12/28/17 [id number]    Cy BlamerPalumbo, Verneal Wiers, MD 12/28/17 [id number]

## 2017-12-28 NOTE — ED Triage Notes (Signed)
Pt reports  left ear pain onset Friday past and gradually worsening

## 2019-07-06 ENCOUNTER — Other Ambulatory Visit: Payer: Self-pay

## 2019-07-06 ENCOUNTER — Emergency Department (HOSPITAL_BASED_OUTPATIENT_CLINIC_OR_DEPARTMENT_OTHER): Payer: No Typology Code available for payment source

## 2019-07-06 ENCOUNTER — Emergency Department (HOSPITAL_BASED_OUTPATIENT_CLINIC_OR_DEPARTMENT_OTHER)
Admission: EM | Admit: 2019-07-06 | Discharge: 2019-07-06 | Disposition: A | Discharge status: Home / Self Care | Payer: No Typology Code available for payment source | Attending: Emergency Medicine | Admitting: Emergency Medicine

## 2019-07-06 ENCOUNTER — Encounter (HOSPITAL_BASED_OUTPATIENT_CLINIC_OR_DEPARTMENT_OTHER): Payer: Self-pay | Admitting: *Deleted

## 2019-07-06 DIAGNOSIS — T148XXA Other injury of unspecified body region, initial encounter: Secondary | ICD-10-CM

## 2019-07-06 DIAGNOSIS — Y9301 Activity, walking, marching and hiking: Secondary | ICD-10-CM | POA: Insufficient documentation

## 2019-07-06 DIAGNOSIS — Z79899 Other long term (current) drug therapy: Secondary | ICD-10-CM | POA: Insufficient documentation

## 2019-07-06 DIAGNOSIS — S161XXA Strain of muscle, fascia and tendon at neck level, initial encounter: Secondary | ICD-10-CM

## 2019-07-06 DIAGNOSIS — Y92481 Parking lot as the place of occurrence of the external cause: Secondary | ICD-10-CM | POA: Insufficient documentation

## 2019-07-06 DIAGNOSIS — S169XXA Unspecified injury of muscle, fascia and tendon at neck level, initial encounter: Secondary | ICD-10-CM | POA: Insufficient documentation

## 2019-07-06 DIAGNOSIS — Y999 Unspecified external cause status: Secondary | ICD-10-CM | POA: Insufficient documentation

## 2019-07-06 DIAGNOSIS — S199XXA Unspecified injury of neck, initial encounter: Secondary | ICD-10-CM | POA: Diagnosis present

## 2019-07-06 DIAGNOSIS — S20211A Contusion of right front wall of thorax, initial encounter: Secondary | ICD-10-CM | POA: Diagnosis not present

## 2019-07-06 DIAGNOSIS — Z87891 Personal history of nicotine dependence: Secondary | ICD-10-CM | POA: Diagnosis not present

## 2019-07-06 MED ORDER — NAPROXEN 500 MG PO TABS: 500.0000 mg | ORAL_TABLET | Freq: Two times a day (BID) | ORAL | 0 refills | Status: AC

## 2019-07-06 MED ORDER — METHOCARBAMOL 500 MG PO TABS: 500.0000 mg | ORAL_TABLET | Freq: Every evening | ORAL | 0 refills | Status: AC | PRN

## 2019-07-06 NOTE — ED Notes (Signed)
Pt verbalized understanding of dc instructions.

## 2019-07-06 NOTE — Discharge Instructions (Addendum)
Take naproxen 2 times a day with meals.  Do not take other anti-inflammatories at the same time (Advil, Motrin, ibuprofen, Aleve). You may supplement with Tylenol if you need further pain control. °Use robaxin as needed for muscle stiffness or soreness.  Have caution, this may make you tired or groggy.  Do not drive or operate heavy machinery while taking this medicine. °Use ice packs or heating pads if this helps control your pain. °You will likely have continued muscle stiffness and soreness over the next couple days.  Follow-up with primary care in 1 week if your symptoms are not improving. °Return to the emergency room if you develop vision changes, vomiting, slurred speech, numbness, loss of bowel or bladder control, or any new or worsening symptoms. ° °
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
## 2019-07-06 NOTE — ED Notes (Signed)
ED Provider at bedside. 
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
## 2023-11-04 ENCOUNTER — Encounter (HOSPITAL_COMMUNITY): Payer: Self-pay

## 2023-11-04 ENCOUNTER — Other Ambulatory Visit: Payer: Self-pay

## 2023-11-04 ENCOUNTER — Emergency Department (HOSPITAL_COMMUNITY)

## 2023-11-04 ENCOUNTER — Emergency Department (HOSPITAL_COMMUNITY)
Admission: EM | Admit: 2023-11-04 | Discharge: 2023-11-04 | Disposition: A | Discharge status: Home / Self Care | Attending: Emergency Medicine | Admitting: Emergency Medicine

## 2023-11-04 DIAGNOSIS — S39012A Strain of muscle, fascia and tendon of lower back, initial encounter: Secondary | ICD-10-CM | POA: Insufficient documentation

## 2023-11-04 DIAGNOSIS — M545 Low back pain, unspecified: Secondary | ICD-10-CM | POA: Diagnosis present

## 2023-11-04 DIAGNOSIS — S161XXA Strain of muscle, fascia and tendon at neck level, initial encounter: Secondary | ICD-10-CM | POA: Insufficient documentation

## 2023-11-04 DIAGNOSIS — Y92410 Unspecified street and highway as the place of occurrence of the external cause: Secondary | ICD-10-CM | POA: Diagnosis not present

## 2023-11-04 DIAGNOSIS — R911 Solitary pulmonary nodule: Secondary | ICD-10-CM | POA: Insufficient documentation

## 2023-11-04 LAB — CBC WITH DIFFERENTIAL/PLATELET
Abs Immature Granulocytes: 0.03 10*3/uL (ref 0.00–0.07)
Basophils Absolute: 0.1 10*3/uL (ref 0.0–0.1)
Basophils Relative: 1 %
Eosinophils Absolute: 0.3 10*3/uL (ref 0.0–0.5)
Eosinophils Relative: 3 %
HCT: 44.9 % (ref 39.0–52.0)
Hemoglobin: 15.1 g/dL (ref 13.0–17.0)
Immature Granulocytes: 0 %
Lymphocytes Relative: 24 %
Lymphs Abs: 2.1 10*3/uL (ref 0.7–4.0)
MCH: 32.1 pg (ref 26.0–34.0)
MCHC: 33.6 g/dL (ref 30.0–36.0)
MCV: 95.3 fL (ref 80.0–100.0)
Monocytes Absolute: 0.7 10*3/uL (ref 0.1–1.0)
Monocytes Relative: 7 %
Neutro Abs: 5.9 10*3/uL (ref 1.7–7.7)
Neutrophils Relative %: 65 %
Platelets: 208 10*3/uL (ref 150–400)
RBC: 4.71 MIL/uL (ref 4.22–5.81)
RDW: 12.5 % (ref 11.5–15.5)
WBC: 9.1 10*3/uL (ref 4.0–10.5)
nRBC: 0 % (ref 0.0–0.2)

## 2023-11-04 LAB — TROPONIN I (HIGH SENSITIVITY): Troponin I (High Sensitivity): 2 ng/L (ref <18)

## 2023-11-04 LAB — BASIC METABOLIC PANEL WITH GFR
Anion gap: 9 (ref 5–15)
BUN: 16 mg/dL (ref 6–20)
CO2: 26 mmol/L (ref 22–32)
Calcium: 8.7 mg/dL — ABNORMAL LOW (ref 8.9–10.3)
Chloride: 102 mmol/L (ref 98–111)
Creatinine, Ser: 1.46 mg/dL — ABNORMAL HIGH (ref 0.61–1.24)
GFR, Estimated: 57 mL/min — ABNORMAL LOW (ref >60)
Glucose, Bld: 110 mg/dL — ABNORMAL HIGH (ref 70–99)
Potassium: 4.2 mmol/L (ref 3.5–5.1)
Sodium: 137 mmol/L (ref 135–145)

## 2023-11-04 MED ORDER — HYDROMORPHONE HCL 1 MG/ML IJ SOLN
1.0000 mg | Freq: Once | INTRAMUSCULAR | Status: AC
  Administered 2023-11-04: 1 mg via INTRAVENOUS
  Filled 2023-11-04: qty 1

## 2023-11-04 MED ORDER — IOHEXOL 300 MG/ML  SOLN
100.0000 mL | Freq: Once | INTRAMUSCULAR | Status: AC | PRN
  Administered 2023-11-04: 100 mL via INTRAVENOUS

## 2023-11-04 MED ORDER — ONDANSETRON HCL 4 MG/2ML IJ SOLN
4.0000 mg | Freq: Once | INTRAMUSCULAR | Status: AC
  Administered 2023-11-04: 4 mg via INTRAVENOUS
  Filled 2023-11-04: qty 2
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
## 2023-11-24 ENCOUNTER — Other Ambulatory Visit: Payer: Self-pay

## 2023-11-24 ENCOUNTER — Ambulatory Visit
Admission: RE | Admit: 2023-11-24 | Discharge: 2023-11-24 | Disposition: A | Discharge status: Home / Self Care | Payer: Self-pay | Source: Ambulatory Visit

## 2023-11-24 DIAGNOSIS — M5412 Radiculopathy, cervical region: Secondary | ICD-10-CM

## 2023-11-24 DIAGNOSIS — M546 Pain in thoracic spine: Secondary | ICD-10-CM

## 2023-11-24 DIAGNOSIS — M25519 Pain in unspecified shoulder: Secondary | ICD-10-CM
